# Patient Record
Sex: Female | Born: 1995 | Race: White | Hispanic: No | Marital: Single | Smoking: Current every day smoker
Health system: Southern US, Community
[De-identification: ages and names within clinical notes are randomized; demographics above are authoritative.]

---

## 2019-03-08 ENCOUNTER — Encounter (HOSPITAL_COMMUNITY): Payer: Self-pay | Admitting: Emergency Medicine

## 2019-03-08 ENCOUNTER — Emergency Department (HOSPITAL_COMMUNITY): Payer: Self-pay

## 2019-03-08 ENCOUNTER — Other Ambulatory Visit: Payer: Self-pay

## 2019-03-08 ENCOUNTER — Emergency Department (HOSPITAL_COMMUNITY)
Admission: EM | Admit: 2019-03-08 | Discharge: 2019-03-09 | Disposition: A | Discharge status: Home / Self Care | Payer: Self-pay | Attending: Emergency Medicine | Admitting: Emergency Medicine

## 2019-03-08 DIAGNOSIS — F4323 Adjustment disorder with mixed anxiety and depressed mood: Secondary | ICD-10-CM

## 2019-03-08 DIAGNOSIS — F315 Bipolar disorder, current episode depressed, severe, with psychotic features: Secondary | ICD-10-CM | POA: Insufficient documentation

## 2019-03-08 DIAGNOSIS — R45851 Suicidal ideations: Secondary | ICD-10-CM | POA: Insufficient documentation

## 2019-03-08 DIAGNOSIS — Y92480 Sidewalk as the place of occurrence of the external cause: Secondary | ICD-10-CM | POA: Insufficient documentation

## 2019-03-08 DIAGNOSIS — X500XXA Overexertion from strenuous movement or load, initial encounter: Secondary | ICD-10-CM | POA: Insufficient documentation

## 2019-03-08 DIAGNOSIS — S99912A Unspecified injury of left ankle, initial encounter: Secondary | ICD-10-CM | POA: Insufficient documentation

## 2019-03-08 DIAGNOSIS — Z20828 Contact with and (suspected) exposure to other viral communicable diseases: Secondary | ICD-10-CM | POA: Insufficient documentation

## 2019-03-08 DIAGNOSIS — Y999 Unspecified external cause status: Secondary | ICD-10-CM | POA: Insufficient documentation

## 2019-03-08 DIAGNOSIS — Y9301 Activity, walking, marching and hiking: Secondary | ICD-10-CM | POA: Insufficient documentation

## 2019-03-08 DIAGNOSIS — F17290 Nicotine dependence, other tobacco product, uncomplicated: Secondary | ICD-10-CM | POA: Insufficient documentation

## 2019-03-08 DIAGNOSIS — F1220 Cannabis dependence, uncomplicated: Secondary | ICD-10-CM | POA: Insufficient documentation

## 2019-03-08 LAB — COMPREHENSIVE METABOLIC PANEL
ALT: 31 U/L (ref 0–44)
AST: 24 U/L (ref 15–41)
Albumin: 4.1 g/dL (ref 3.5–5.0)
Alkaline Phosphatase: 94 U/L (ref 38–126)
Anion gap: 8 (ref 5–15)
BUN: 7 mg/dL (ref 6–20)
CO2: 26 mmol/L (ref 22–32)
Calcium: 9.6 mg/dL (ref 8.9–10.3)
Chloride: 104 mmol/L (ref 98–111)
Creatinine, Ser: 0.57 mg/dL (ref 0.44–1.00)
GFR calc Af Amer: >60 mL/min (ref >60)
GFR calc non Af Amer: >60 mL/min (ref >60)
Glucose, Bld: 101 mg/dL — ABNORMAL HIGH (ref 70–99)
Potassium: 4.4 mmol/L (ref 3.5–5.1)
Sodium: 138 mmol/L (ref 135–145)
Total Bilirubin: 0.8 mg/dL (ref 0.3–1.2)
Total Protein: 8.1 g/dL (ref 6.5–8.1)

## 2019-03-08 LAB — I-STAT BETA HCG BLOOD, ED (NOT ORDERABLE): I-stat hCG, quantitative: <5 m[IU]/mL (ref <5)

## 2019-03-08 LAB — CBC
HCT: 43.3 % (ref 36.0–46.0)
Hemoglobin: 13.7 g/dL (ref 12.0–15.0)
MCH: 27.2 pg (ref 26.0–34.0)
MCHC: 31.6 g/dL (ref 30.0–36.0)
MCV: 85.9 fL (ref 80.0–100.0)
Platelets: 239 10*3/uL (ref 150–400)
RBC: 5.04 MIL/uL (ref 3.87–5.11)
RDW: 13.5 % (ref 11.5–15.5)
WBC: 7.7 10*3/uL (ref 4.0–10.5)
nRBC: 0 % (ref 0.0–0.2)

## 2019-03-08 LAB — RAPID URINE DRUG SCREEN, HOSP PERFORMED
Amphetamines: NOT DETECTED
Barbiturates: NOT DETECTED
Benzodiazepines: NOT DETECTED
Cocaine: NOT DETECTED
Opiates: NOT DETECTED
Tetrahydrocannabinol: POSITIVE — AB

## 2019-03-08 LAB — SALICYLATE LEVEL: Salicylate Lvl: <7 mg/dL (ref 2.8–30.0)

## 2019-03-08 LAB — ETHANOL: Alcohol, Ethyl (B): <10 mg/dL (ref <10)

## 2019-03-08 LAB — SARS CORONAVIRUS 2 BY RT PCR (HOSPITAL ORDER, PERFORMED IN ~~LOC~~ HOSPITAL LAB): SARS Coronavirus 2: NEGATIVE

## 2019-03-08 LAB — ACETAMINOPHEN LEVEL: Acetaminophen (Tylenol), Serum: <10 ug/mL — ABNORMAL LOW (ref 10–30)

## 2019-03-08 MED ORDER — ZOLPIDEM TARTRATE 5 MG PO TABS: 5.0000 mg | ORAL_TABLET | Freq: Every evening | ORAL | Status: DC | PRN

## 2019-03-08 MED ORDER — NAPROXEN 500 MG PO TABS
500.0000 mg | ORAL_TABLET | Freq: Once | ORAL | Status: AC
  Administered 2019-03-08: 500 mg via ORAL
  Filled 2019-03-08: qty 1

## 2019-03-08 MED ORDER — IBUPROFEN 200 MG PO TABS: 600.0000 mg | ORAL_TABLET | Freq: Three times a day (TID) | ORAL | Status: DC | PRN

## 2019-03-08 MED ORDER — ONDANSETRON HCL 4 MG PO TABS: 4.0000 mg | ORAL_TABLET | Freq: Three times a day (TID) | ORAL | Status: DC | PRN

## 2019-03-08 MED ORDER — ALUM & MAG HYDROXIDE-SIMETH 200-200-20 MG/5ML PO SUSP: 30.0000 mL | Freq: Four times a day (QID) | ORAL | Status: DC | PRN
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
## 2019-03-08 NOTE — BHH Counselor (Signed)
Per Dr. Mariea Clonts patient meets in patient criteria. TTS to seek placement.

## 2019-03-08 NOTE — ED Notes (Signed)
Patient given meal tray.

## 2019-03-08 NOTE — ED Provider Notes (Signed)
[facility]-EMERGENCY DEPT Provider Note   CSN: [id number] Arrival date & time: 03/08/19  1000     History   Chief Complaint Chief Complaint  Patient presents with  . Ankle Pain  . Medical Clearance    HPI Shelby Benitez is a 23 y.o. female with a hx of tobacco abuse who presents to the ED w/ complaints of left ankle pain s/p injury 3 days prior as well as thoughts of self harm.  Patient reports that she was ambulating on a sidewalk while carrying something, she could not see her feet and stepped off the edge resulting in an inversion injury to the left ankle.  She does not follow either to ground or hit her head or have loss of consciousness.  She is having pain primarily to the lateral left ankle with associated swelling.  Worse with weightbearing.  No alleviating factors.  Denies other areas of injury.  Denies numbness, tingling, or weakness.  Patient also mentions that she has struggled with mental health and increased stress throughout her whole life.  She reports she has had recent increase in stress with being homeless and having trouble with money.  She states she has had thoughts of self-harm including overdosing as well as cutting herself with a broken dish at work.  She states that she sometimes hears voices but this is not necessarily new.  She denies homicidal ideations.     HPI  History reviewed. No pertinent past medical history.  There are no active problems to display for this patient.   History reviewed. No pertinent surgical history.   OB History   No obstetric history on file.      Home Medications    Prior to Admission medications   Not on File    Family History No family history on file.  Social History Social History   Tobacco Use  . Smoking status: Current Every Day Smoker    Types: Cigars  . Smokeless tobacco: Never Used  Substance Use Topics  . Alcohol use: Not on file  . Drug use: Not on file     Allergies    Patient has no known allergies.   Review of Systems Review of Systems  Constitutional: Negative for chills and fever.  Respiratory: Negative for shortness of breath.   Cardiovascular: Negative for chest pain.  Gastrointestinal: Negative for abdominal pain.  Musculoskeletal: Positive for arthralgias and joint swelling.  Skin: Negative for color change and wound.  Neurological: Negative for weakness and numbness.  Psychiatric/Behavioral: Positive for hallucinations and suicidal ideas.  All other systems reviewed and are negative.    Physical Exam Updated Vital Signs BP (!) 146/99 (BP Location: Left Arm)   Pulse 76   Temp 99 F (37.2 C)   Resp 18   LMP  (LMP Unknown)   SpO2 100%   Physical Exam Vitals signs and nursing note reviewed.  Constitutional:      General: She is not in acute distress.    Appearance: She is well-developed. She is not ill-appearing or toxic-appearing.  HENT:     Head: Normocephalic and atraumatic.  Eyes:     General:        Right eye: No discharge.        Left eye: No discharge.     Conjunctiva/sclera: Conjunctivae normal.  Neck:     Musculoskeletal: Neck supple.  Cardiovascular:     Rate and Rhythm: Normal rate and regular rhythm.     Pulses:  Dorsalis pedis pulses are 2+ on the right side and 2+ on the left side.       Posterior tibial pulses are 2+ on the right side and 2+ on the left side.  Pulmonary:     Effort: Pulmonary effort is normal. No respiratory distress.     Breath sounds: Normal breath sounds. No wheezing, rhonchi or rales.  Abdominal:     General: There is no distension.     Palpations: Abdomen is soft.     Tenderness: There is no abdominal tenderness.  Musculoskeletal:     Comments: Lower extremities: No obvious deformity, erythema, ecchymosis, warmth, or open wounds. Patient has mild left lateral ankle swelling. Patient has intact AROM to bilateral hips, knees, ankles, and all digits. Tender to palpation over the  fibular head, lateral/medial malleolus, lateral ankle ligaments, & base of the 5th metatarsal to the LLE. Otherwise nontender.   Skin:    General: Skin is warm and dry.     Capillary Refill: Capillary refill takes less than 2 seconds.     Findings: No rash.  Neurological:     Mental Status: She is alert.     Comments: Alert. Clear speech. Sensation grossly intact to bilateral lower extremities. 5/5 strength with plantar/dorsiflexion bilaterally. Patient able to weightbear  Psychiatric:        Thought Content: Thought content includes suicidal ideation. Thought content does not include homicidal ideation. Thought content includes suicidal plan. Thought content does not include homicidal plan.     Comments: Does not appear to be responding to internal stimuli.     ED Treatments / Results  Labs (all labs ordered are listed, but only abnormal results are displayed) Labs Reviewed  COMPREHENSIVE METABOLIC PANEL - Abnormal; Notable for the following components:      Result Value   Glucose, Bld 101 (*)    All other components within normal limits  ACETAMINOPHEN LEVEL - Abnormal; Notable for the following components:   Acetaminophen (Tylenol), Serum <10 (*)    All other components within normal limits  CBC  ETHANOL  SALICYLATE LEVEL  RAPID URINE DRUG SCREEN, HOSP PERFORMED  I-STAT BETA HCG BLOOD, ED ([facility], [facility], [facility] ONLY)  I-STAT BETA HCG BLOOD, ED (NOT ORDERABLE)    EKG None  Radiology Dg Tibia/fibula Left  Result Date: 03/08/2019 CLINICAL DATA:  Left ankle pain after slipping. EXAM: LEFT TIBIA AND FIBULA - 2 VIEW COMPARISON:  None. FINDINGS: There is no evidence of fracture or other focal bone lesions. Soft tissues are unremarkable. IMPRESSION: Negative. Electronically Signed   By: Lupita RaiderJames  Green Jr M.D.   On: 03/08/2019 12:39   Dg Ankle Complete Left  Result Date: 03/08/2019 CLINICAL DATA:  Pain following fall EXAM: LEFT ANKLE COMPLETE - 3+ VIEW COMPARISON:  None. FINDINGS: Frontal,  oblique, and lateral views were obtained. There is soft tissue swelling laterally. No fracture or joint effusion. No appreciable joint space narrowing or erosion. Ankle mortise appears intact. IMPRESSION: Soft tissue swelling laterally. No evident fracture or appreciable arthropathy. Ankle mortise appears intact. Electronically Signed   By: Bretta BangWilliam  Woodruff III M.D.   On: 03/08/2019 11:12   Dg Foot Complete Left  Result Date: 03/08/2019 CLINICAL DATA:  Injury.  Twisted ankle. EXAM: LEFT FOOT - COMPLETE 3+ VIEW COMPARISON:  No recent prior. FINDINGS: No acute bony or joint abnormality identified. No evidence of fracture or dislocation. IMPRESSION: No acute abnormality. Electronically Signed   By: Maisie Fushomas  Register   On: 03/08/2019 12:35  Procedures Procedures (including critical care time)  Medications Ordered in ED Medications - No data to display   Initial Impression / Assessment and Plan / ED Course  I have reviewed the triage vital signs and the nursing notes.  Pertinent labs & imaging results that were available during my care of the patient were reviewed by me and considered in my medical decision making (see chart for details).   Patient presents to the emergency department status post left ankle injury a few days prior also with complaints of thoughts of self-harm with plans to overdose and/or cut herself with a plate at work.  She is nontoxic-appearing, no apparent distress, vitals WNL with the exception of elevated blood pressure, doubt HTN emergency, PCP follow-up.  Regarding her left ankle injury: No fever/erythema/warmth, not consistent with septic joint.  X-rays negative for fracture or dislocation.  Neurovascularly intact distally.  Likely sprain.  Will place an ASO.  Regarding mental health: Screening labs unremarkable.  Medically cleared for TTS evaluation.  Disposition per behavioral health.  Per [facility] patient meets inpatient criteria, pending placement.  Holding psychiatric  orders have been placed.  I discussed with the patient who is agreeable.   Final Clinical Impressions(s) / ED Diagnoses   Final diagnoses:  Injury of left ankle, initial encounter  Suicidal thoughts    ED Discharge Orders    None       Amaryllis Dyke, PA-C 03/08/19 1434    Valarie Merino, MD 03/15/19 [phone]

## 2019-03-08 NOTE — ED Notes (Signed)
Ok to apply ankle ASO per Mason PA.

## 2019-03-08 NOTE — ED Triage Notes (Signed)
Pt reports that was moving stuff and slipped on curb and twisted left ankle. Has wrapped her self with ACE bandage. Reports that pain is worse esp with walking on it. reports been very stressed and having thoughts of harming her self with a plan of cutting. Reports she hasnt cut herself in a long time.

## 2019-03-08 NOTE — ED Notes (Signed)
Patient given crackers, peanut butter, and water. 

## 2019-03-08 NOTE — ED Notes (Signed)
Patient changed into paper scrubs and wanded by security. One labeled patient belongings bag at triage nurse's station. 

## 2019-03-08 NOTE — Progress Notes (Signed)
Pt meets inpatient criteria per Dr. Mariea Clonts. Referral information has been sent to the following hospitals for review:  [facility]  [facility]  [facility]   Disposition will continue to assist with inpatient placement needs.   Audree Camel, LCSW, [place] [facility]position [facility]/TTS [phone]

## 2019-03-09 ENCOUNTER — Encounter (HOSPITAL_COMMUNITY): Payer: Self-pay | Admitting: Registered Nurse

## 2019-03-09 DIAGNOSIS — R45851 Suicidal ideations: Secondary | ICD-10-CM

## 2019-03-09 DIAGNOSIS — F17290 Nicotine dependence, other tobacco product, uncomplicated: Secondary | ICD-10-CM

## 2019-03-09 DIAGNOSIS — F4323 Adjustment disorder with mixed anxiety and depressed mood: Secondary | ICD-10-CM

## 2019-03-09 DIAGNOSIS — Z915 Personal history of self-harm: Secondary | ICD-10-CM

## 2019-03-09 NOTE — Discharge Instructions (Signed)
For your behavioral health needs you are advised to follow up with [facility].  New patients are seen at their walk-in clinic.  Walk-in hours are Monday - Friday from 8:30 am - 12:00 pm, and from 1:00 pm - 2:30 pm.  Walk-in patients are seen on a first come, first served basis, so try to arrive as early as possible for the best chance of being seen the same day:       [facility]      [address]      [place], [place] [address]      [phone] 

## 2019-03-09 NOTE — ED Notes (Signed)
Pt is calm and cooperative.  She arrived to unit and got her lunch.  No complaints voiced.  15 minute checks and video monitoring in place.

## 2019-03-09 NOTE — ED Notes (Signed)
Pt discharged safely after reviewing discharge instructions.  Pt was in no distress at discharge and was calling family for a ride .

## 2019-03-09 NOTE — BH Assessment (Signed)
BHH Assessment Progress Note  Per Jacqueline Norman, DO, this pt does not require psychiatric hospitalization at this time.  [facility] is to be discharged from [facility] with recommendation to follow up with [facility].  This has been included in pt's discharge instructions.  Pt's nurse has been notified.  Cecely Rengel, MA Triage Specialist [phone]     

## 2019-03-09 NOTE — Consult Note (Addendum)
[facility]  03/09/2019 5:56 PM Shelby SchaumannCassie Benitez  MRN:  [id number] Principal Problem: Adjustment disorder with mixed anxiety and depressed mood Discharge Diagnoses: Principal Problem:   Adjustment disorder with mixed anxiety and depressed mood   Subjective: Shelby Schaumannassie Touchette, 23 y.o., female patient seen via tele psych by this provider, Dr. Sharma CovertNorman; and chart reviewed on 03/09/19.  On evaluation Shelby Benitez reports "I came in yesterday with sprained ankle and when they was asking me questions they said I had suicidal tendency and that I would need evaluation."   Patient states that she does have a history of self harming (cutting/burning self) but she the last time she done anything like that was when she was in highschool.  Prior suicide attempt "a couple years ago."  Patient denies suicidal/self-harm/homicidal ideation, psychosis, and paranoia.  Patient states that she is not current taking psychotropics and doesn't feel she needs any at this time but is interested in outpatient psychiatric services.  Patient in ED over night and no complaints or reports of odd, or behavioral outburst.  Patient states she just move here from [place] but has not started school yet.  Denies history of Alcohol or drug use disorder and states she has not drank or used anything recently.   During evaluation [name] is alert/oriented x 4; calm/cooperative; and mood is congruent with affect.  She does not appear to be responding to internal/external stimuli or delusional thoughts.  Patient denies suicidal/self-harm/homicidal ideation, psychosis, and paranoia.  Patient answered question appropriately.     Total Time spent with patient: 30 minutes  Past Psychiatric History: Prior suicide attempt, self harm, Depression  Past Medical History: History reviewed. No pertinent past medical history. History reviewed. No pertinent surgical history. Family History: History reviewed. No pertinent family history. Family  Psychiatric  History: Mother and grandmother-bipolar disorder. Family history of suicide attempts.   Social History:  Social History   Substance and Sexual Activity  Alcohol Use None     Social History   Substance and Sexual Activity  Drug Use Not on file    Social History   Socioeconomic History  . Marital status: Single    Spouse name: Not on file  . Number of children: Not on file  . Years of education: Not on file  . Highest education level: Not on file  Occupational History  . Not on file  Social Needs  . Financial resource strain: Not on file  . Food insecurity    Worry: Not on file    Inability: Not on file  . Transportation needs    Medical: Not on file    Non-medical: Not on file  Tobacco Use  . Smoking status: Current Every Day Smoker    Types: Cigars  . Smokeless tobacco: Never Used  Substance and Sexual Activity  . Alcohol use: Not on file  . Drug use: Not on file  . Sexual activity: Not on file  Lifestyle  . Physical activity    Days per week: Not on file    Minutes per session: Not on file  . Stress: Not on file  Relationships  . Social Musicianconnections    Talks on phone: Not on file    Gets together: Not on file    Attends religious service: Not on file    Active member of club or organization: Not on file    Attends meetings of clubs or organizations: Not on file    Relationship status: Not on file  Other  Topics Concern  . Not on file  Social History Narrative  . Not on file    Has this patient used any form of tobacco in the last 30 days? (Cigarettes, Smokeless Tobacco, Cigars, and/or Pipes) A prescription for an FDA-approved tobacco cessation medication was offered at discharge and the patient refused  Current Medications: Current Facility-Administered Medications  Medication Dose Route Frequency Provider Last Rate Last Dose  . alum & mag hydroxide-simeth (MAALOX/MYLANTA) 200-200-20 MG/5ML suspension 30 mL  30 mL Oral Q6H PRN Petrucelli,  Samantha R, PA-C      . ibuprofen (ADVIL) tablet 600 mg  600 mg Oral Q8H PRN Petrucelli, Samantha R, PA-C      . ondansetron (ZOFRAN) tablet 4 mg  4 mg Oral Q8H PRN Petrucelli, Samantha R, PA-C      . zolpidem (AMBIEN) tablet 5 mg  5 mg Oral QHS PRN Petrucelli, Samantha R, PA-C       No current outpatient medications on file.   PTA Medications: (Not in a hospital admission)   Musculoskeletal: Strength & Muscle Tone: within normal limits Gait & Station: normal Patient leans: N/A  Psychiatric Specialty Exam: Physical Exam  Nursing note and vitals reviewed. Constitutional: She is oriented to person, place, and time. She appears well-developed and well-nourished.  HENT:  Head: Normocephalic and atraumatic.  Neck: Normal range of motion.  Respiratory: Effort normal.  Musculoskeletal: Normal range of motion.  Neurological: She is alert and oriented to person, place, and time.  Psychiatric: Her speech is normal and behavior is normal. Judgment and thought content normal. Cognition and memory are normal. She exhibits a depressed mood.    Review of Systems  Psychiatric/Behavioral: Positive for suicidal ideas. Negative for hallucinations and substance abuse.  All other systems reviewed and are negative.   Blood pressure 115/68, pulse 64, temperature 99.1 F (37.3 C), temperature source Oral, resp. rate 16, height 5\' 8"  (1.727 m), weight 93.9 kg, SpO2 100 %.Body mass index is 31.47 kg/m.  General Appearance: Casual  Eye Contact:  Good  Speech:  Clear and Coherent and Normal Rate  Volume:  Normal  Mood:  "Good" Appropriate  Affect:  Appropriate and Congruent  Thought Process:  Coherent, Goal Directed and Descriptions of Associations: Intact  Orientation:  Full (Time, Place, and Person)  Thought Content:  WDL  Suicidal Thoughts:  Reports chronic, intermittent SI without a plan or intention to harm self.   Homicidal Thoughts:  No  Memory:  Immediate;   Good Recent;   Good Remote;    Good  Judgement:  Intact  Insight:  Present  Psychomotor Activity:  Normal  Concentration:  Concentration: Good and Attention Span: Good  Recall:  Good  Fund of Knowledge:  Good  Language:  Good  Akathisia:  No  Handed:  Right  AIMS (if indicated):   N/A  Assets:  Communication Skills Desire for Improvement Housing Social Support Transportation  ADL's:  Intact  Cognition:  WNL  Sleep:   N/A     Demographic Factors:  Caucasian  Loss Factors: NA  Historical Factors: NA  Risk Reduction Factors:   Religious beliefs about death and Positive social support  Continued Clinical Symptoms:  Previous Psychiatric Diagnoses and Treatments  Cognitive Features That Contribute To Risk:  None    Suicide Risk:  Minimal: No identifiable suicidal ideation.  Patients presenting with no risk factors but with morbid ruminations; may be classified as minimal risk based on the severity of the depressive symptoms  Plan Of Care/Follow-up recommendations:  Activity:  As tolerated Diet:  Heart healthy Other:  Follow up with resources given  Disposition: No evidence of imminent risk to self or others at present.   Patient does not meet criteria for psychiatric inpatient admission. Supportive therapy provided about ongoing stressors. Discussed crisis plan, support from social network, calling 911, coming to the Emergency Department, and calling Suicide Hotline.  Shuvon Rankin, NP 03/09/2019, 5:56 PM   Patient seen by telemedicine for psychiatric evaluation, chart reviewed and case discussed with the physician extender and developed treatment plan. Reviewed the information documented and agree with the treatment plan.  Juanetta Beets, DO 03/09/19 7:07 PM

## 2021-01-01 IMAGING — CR LEFT FOOT - COMPLETE 3+ VIEW
3 series · 3 of 3 positions shown · non-contrast
Comparison: No recent prior.

CLINICAL DATA: Injury.  Twisted ankle.

EXAM:
LEFT FOOT - COMPLETE 3+ VIEW

[x foot ap left]
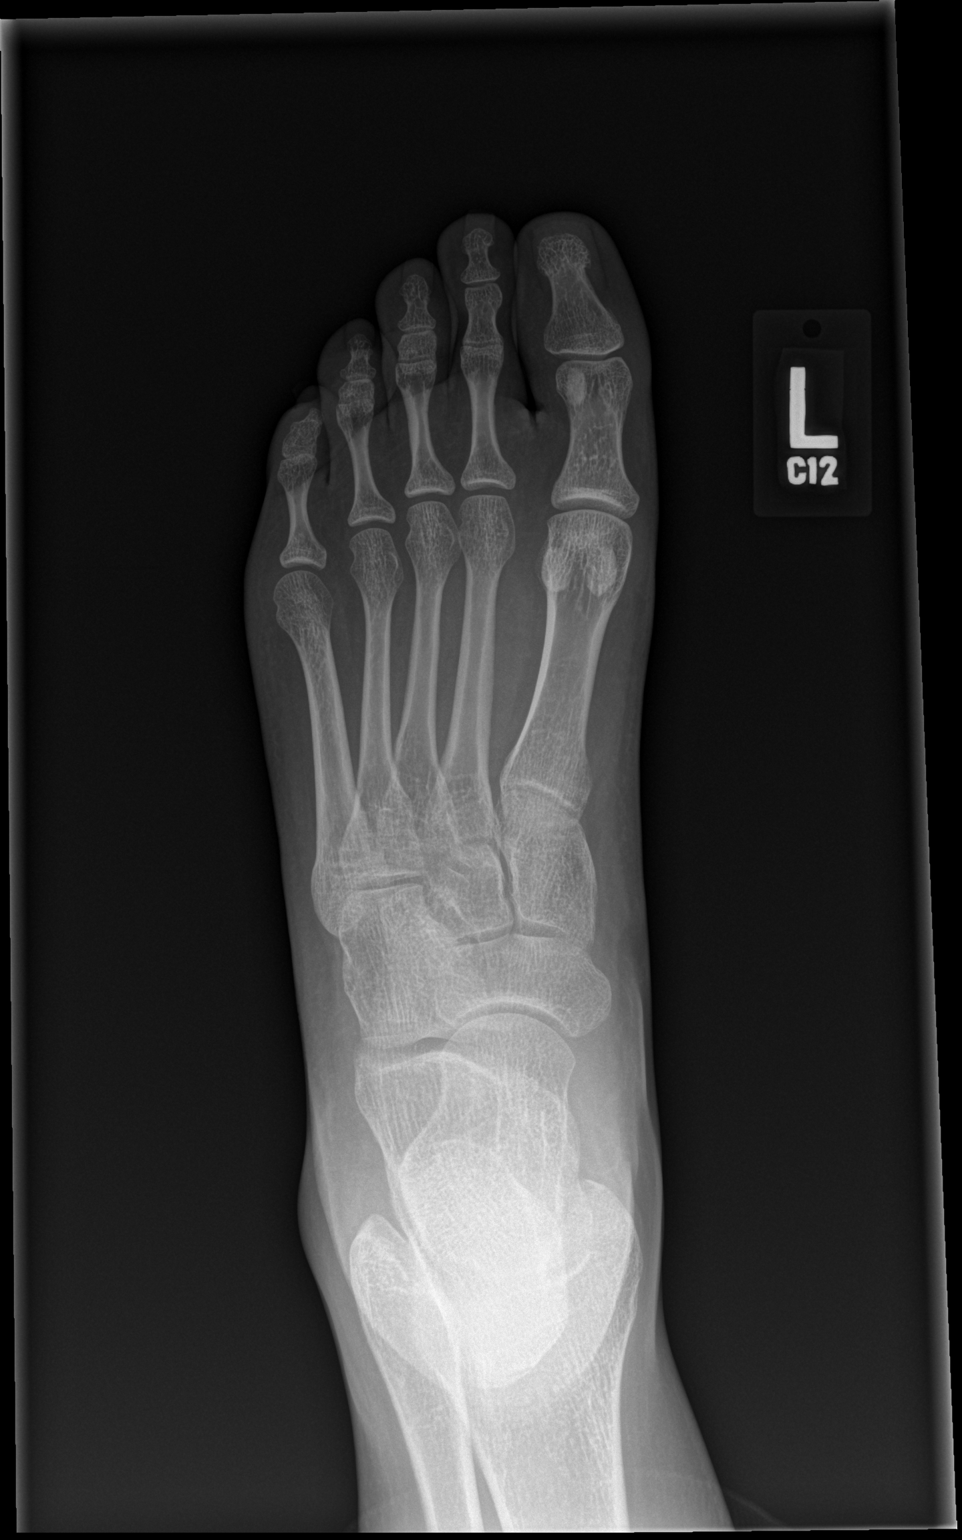

[x foot obl left]
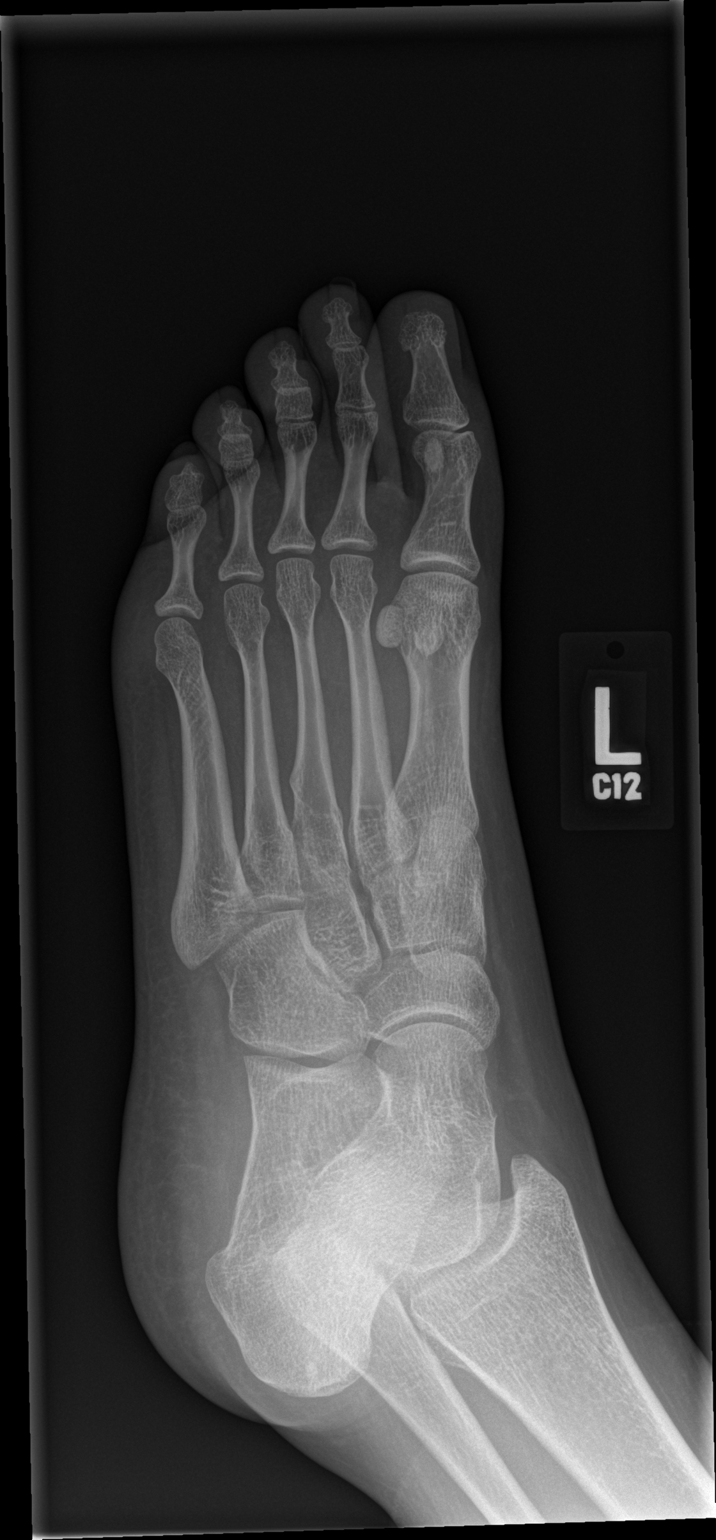

[x foot lat left]
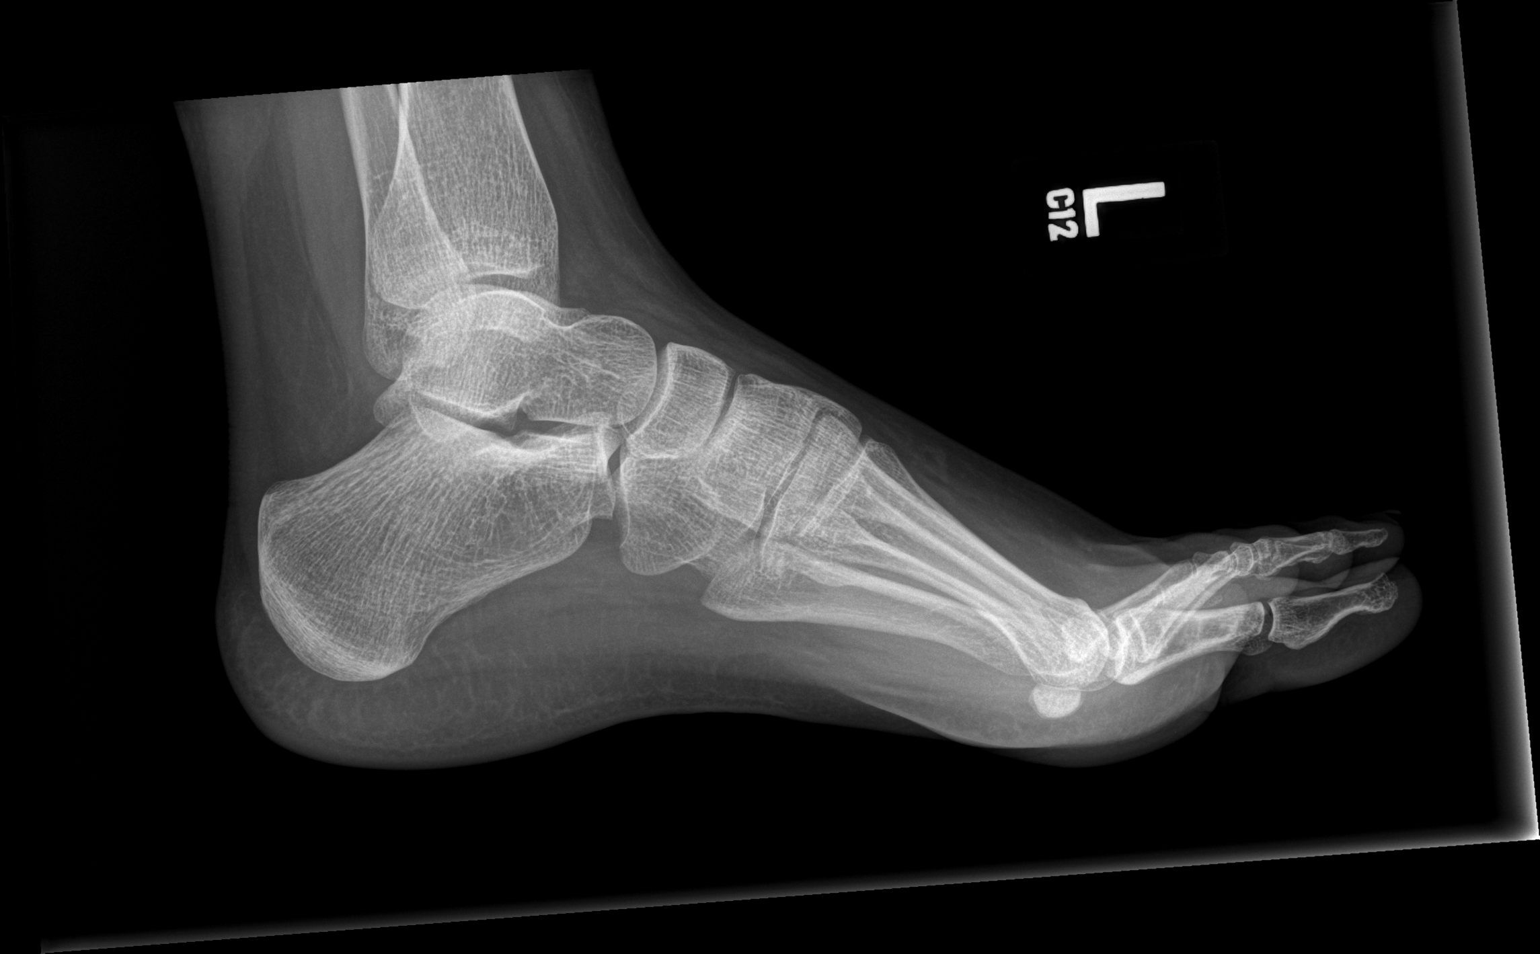

[3 of 3 positions shown; findings below may reference images not displayed]

FINDINGS: No acute bony or joint abnormality identified. No evidence of
fracture or dislocation.
IMPRESSION: No acute abnormality.
